# Patient Record
Sex: Male | Born: 1945
Health system: Southern US, Community
[De-identification: ages and names within clinical notes are randomized; demographics above are authoritative.]

## PROBLEM LIST (undated history)

## (undated) DIAGNOSIS — L570 Actinic keratosis: Secondary | ICD-10-CM

## (undated) DIAGNOSIS — I2510 Atherosclerotic heart disease of native coronary artery without angina pectoris: Secondary | ICD-10-CM

## (undated) DIAGNOSIS — E785 Hyperlipidemia, unspecified: Secondary | ICD-10-CM

## (undated) DIAGNOSIS — N200 Calculus of kidney: Secondary | ICD-10-CM

## (undated) DIAGNOSIS — T7840XA Allergy, unspecified, initial encounter: Secondary | ICD-10-CM

## (undated) HISTORY — DX: Allergy, unspecified, initial encounter: T78.40XA

## (undated) HISTORY — DX: Hyperlipidemia, unspecified: E78.5

## (undated) HISTORY — DX: Calculus of kidney: N20.0

## (undated) HISTORY — DX: Actinic keratosis: L57.0

## (undated) HISTORY — DX: Atherosclerotic heart disease of native coronary artery without angina pectoris: I25.10

## (undated) HISTORY — PX: OTHER SURGICAL HISTORY: SHX169

## (undated) HISTORY — PX: EYE SURGERY: SHX253

---

## 2004-07-24 ENCOUNTER — Ambulatory Visit: Payer: Self-pay | Admitting: *Deleted

## 2012-12-19 ENCOUNTER — Ambulatory Visit: Payer: Self-pay | Admitting: Family Medicine

## 2014-10-05 IMAGING — US SCREENING ULTRASOUND OF ABDOMINAL AORTA
1 series · 14 of 16 positions shown · non-contrast
Comparison: none

REASON FOR EXAM: Welcome to medicare
COMMENTS:

PROCEDURE:     GJULIO - GJULIO AORTA SCREENING/FAMILY HX  - December 19, 2012  [DATE]
RESULT:     History: Medicare screening .

[Series 1: screening ultrasound of abdominal aorta · 0.31mm/px · 14 of 16 slices shown]
[im 1/16]
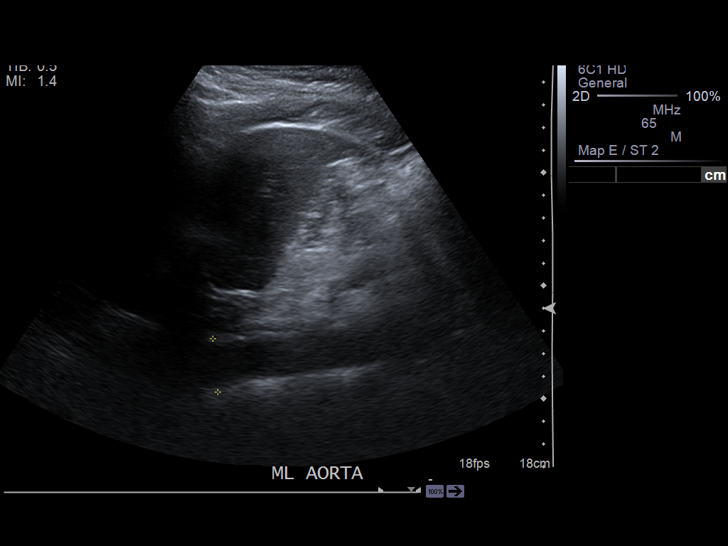
[im 2/16]
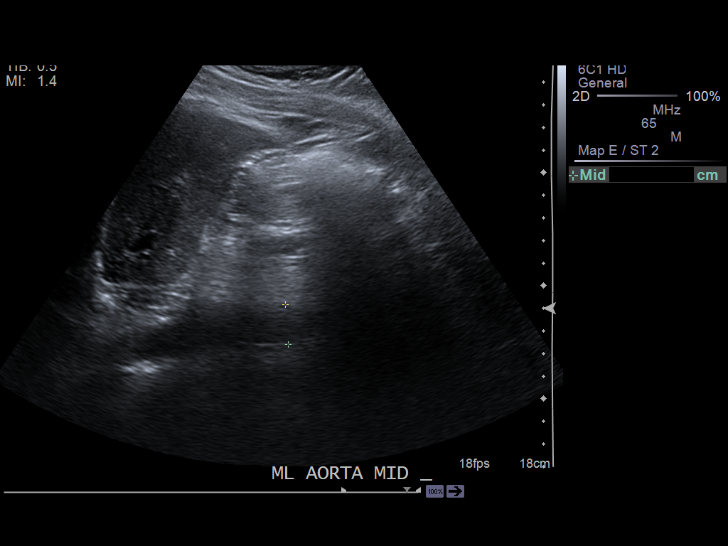
[im 3/16]
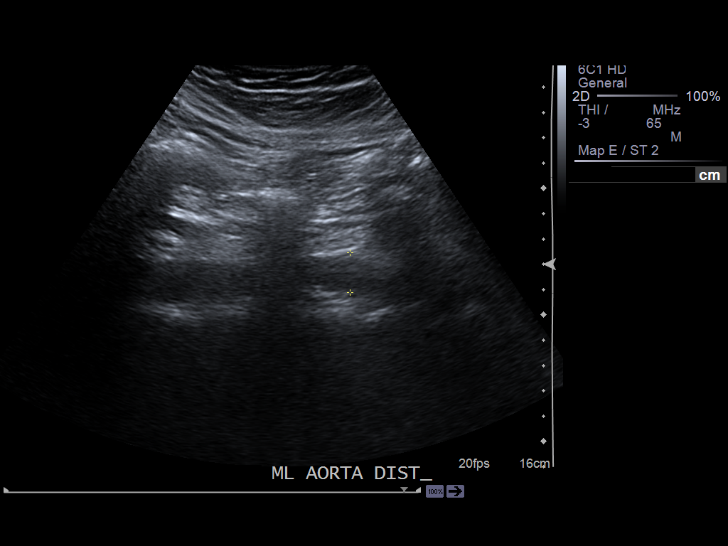
[im 5/16]
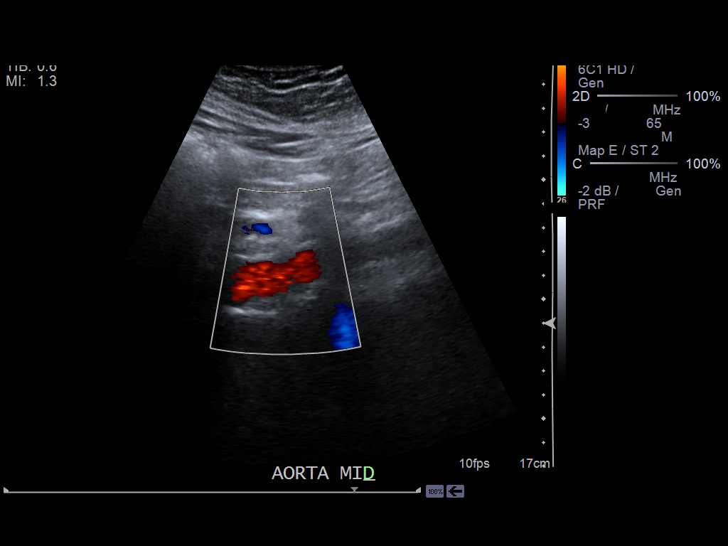
[im 6/16]
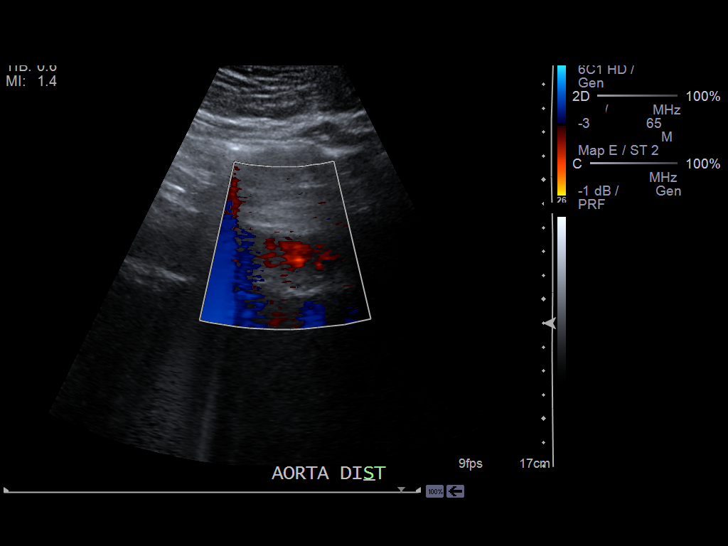
[im 7/16]
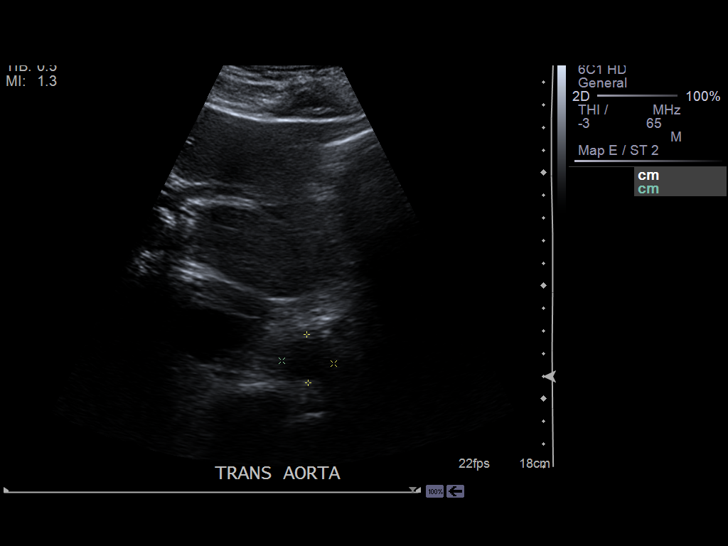
[im 8/16]
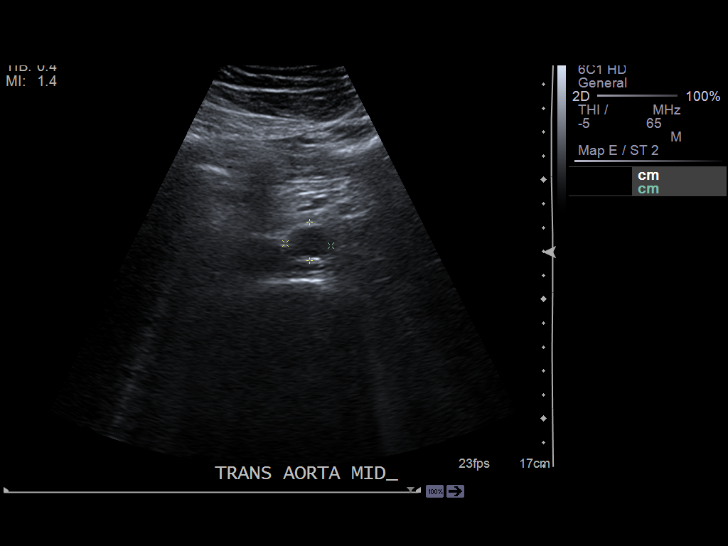
[im 9/16]
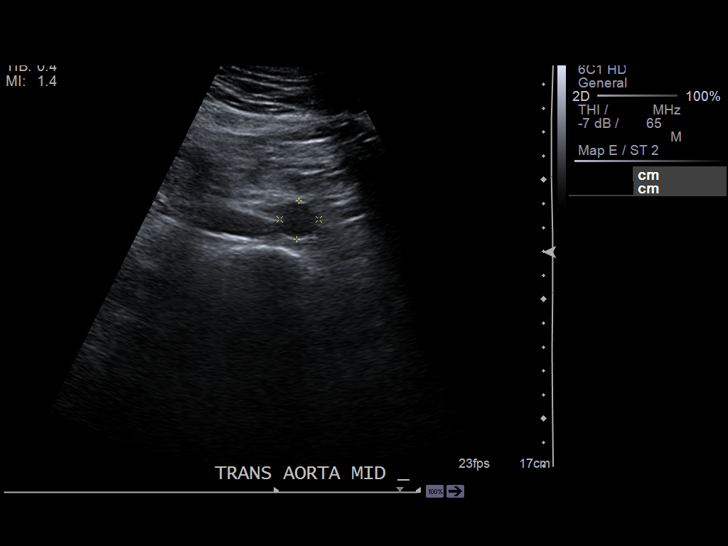
[im 10/16]
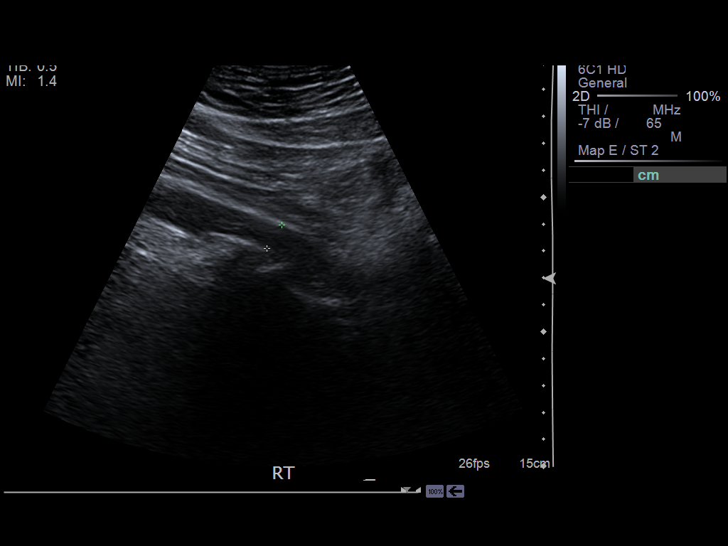
[im 11/16]
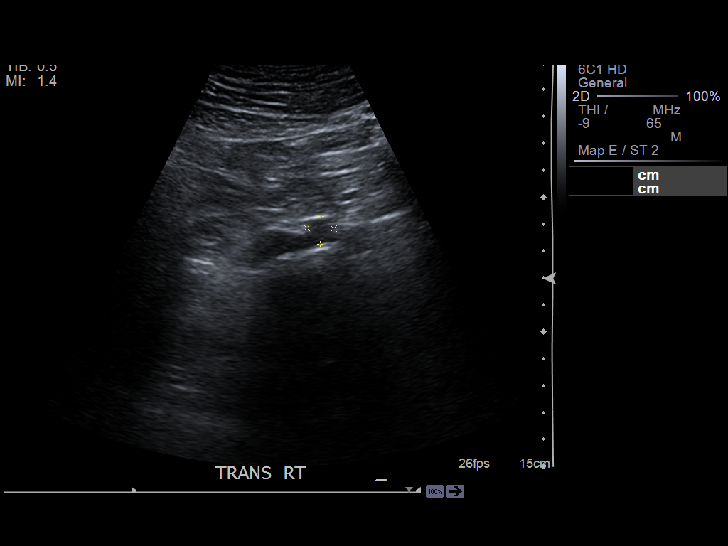
[im 13/16]
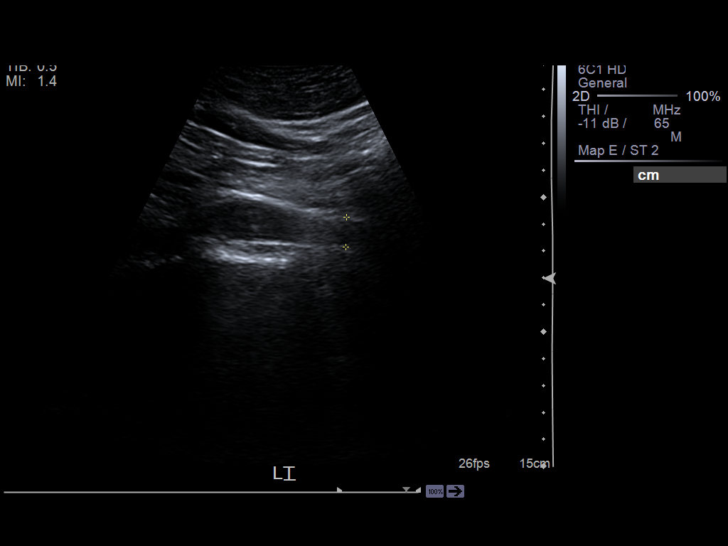
[im 14/16]
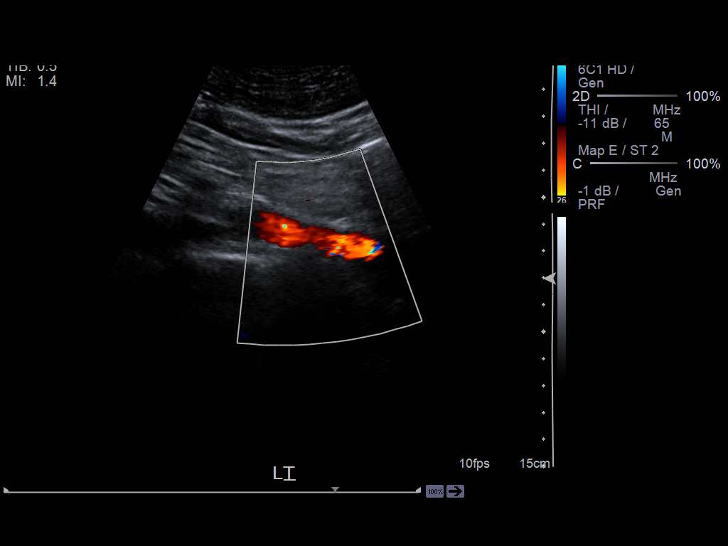
[im 15/16]
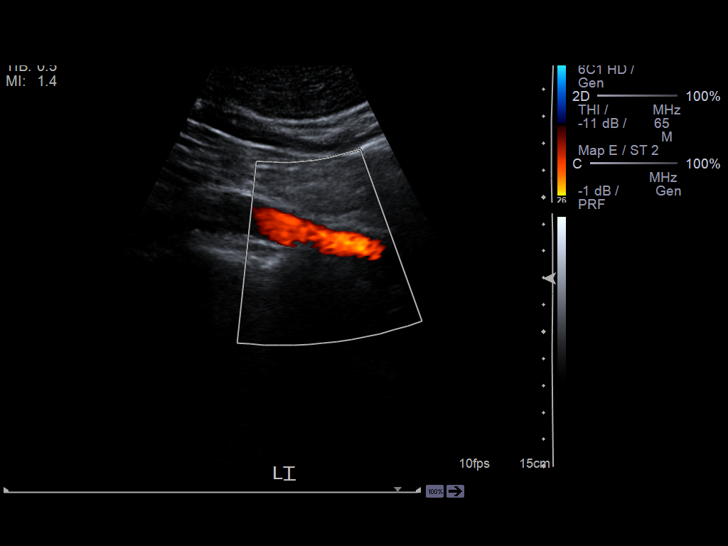
[im 16/16]
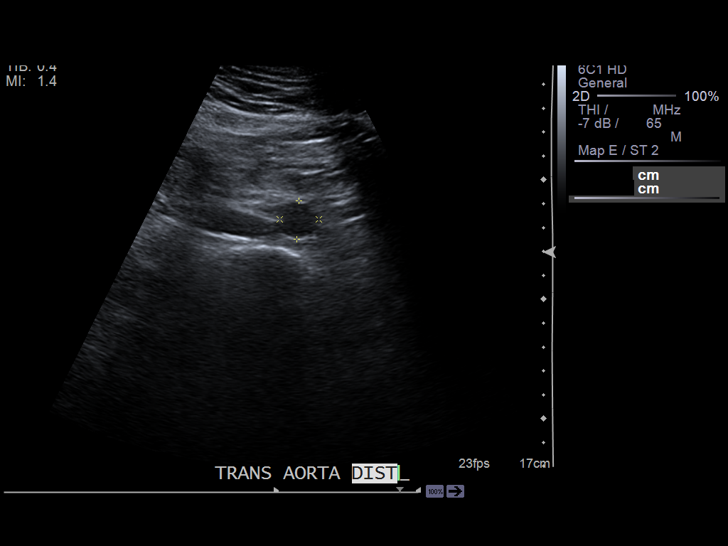

[14 of 16 positions shown; findings below may reference images not displayed]

FINDINGS: Abdominal aorta normal caliber. Aorta measures 2.3 cm in maximum
diameter. No aneurysm.
IMPRESSION: Normal exam.

## 2014-12-28 ENCOUNTER — Ambulatory Visit
Admit: 2014-12-28 | Disposition: A | Discharge status: Home / Self Care | Payer: Self-pay | Attending: Ophthalmology | Admitting: Ophthalmology

## 2015-01-02 NOTE — Op Note (Signed)
PATIENT NAME:  Walter Drake, Walter Drake MR#:  [id number] DATE OF BIRTH:  October 25, 1945  DATE OF PROCEDURE:  12/28/2014  PREOPERATIVE DIAGNOSIS:  Nuclear sclerotic cataract of the left eye.   POSTOPERATIVE DIAGNOSIS:  Nuclear sclerotic cataract of the left eye.   OPERATIVE PROCEDURE:  Cataract extraction by phacoemulsification with implant of intraocular lens to left eye.   SURGEON:  Birder Robson, MD.   ANESTHESIA:  1. Managed anesthesia care.  2. Topical tetracaine drops followed by 2% Xylocaine jelly applied in the preoperative holding area.   COMPLICATIONS:  None.   TECHNIQUE:   Stop and chop.  DESCRIPTION OF PROCEDURE:  The patient was examined and consented in the preoperative holding area where the aforementioned topical anesthesia was applied to the left eye.  The patient was brought back to the Operating Room where he was sat upright on the gurney and given a target to fixate upon while the eye was marked at the 3:00 and 9:00 position.  The patient was then reclined on the operating table, and lidocaine jelly was applied to the left eye.  The eye was prepped and draped in the usual sterile ophthalmic fashion and a lid speculum was placed. A paracentesis was created with the side port blade and the anterior chamber was filled with viscoelastic. A near clear corneal incision was performed with the steel keratome. A continuous curvilinear capsulorrhexis was performed with a cystotome followed by the capsulorrhexis forceps. Hydrodissection and hydrodelineation were carried out with BSS on a blunt cannula. The lens was removed in a stop and chop technique and the remaining cortical material was removed with the irrigation-aspiration handpiece. The eye was inflated with viscoelastic and the Tecnis Toric ZCT225, 22.0-diopter lens, serial number [id number] was placed in the eye and rotated to within a few degrees of the predetermined orientation at 109 degrees.  The remaining viscoelastic was removed from  the eye.  The Sinskey hook was used to rotate the toric lens into its final resting place at 109 degrees.  0.1 mL of cefuroxime concentration 10 mg/mL was placed in the anterior chamber. The eye was inflated to a physiologic pressure and found to be watertight.  The eye was dressed with Vigamox. The patient was given protective glasses to wear throughout the day and a shield with which to sleep tonight. The patient was also given drops with which to begin a drop regimen today and will follow-up with me in one day.    ____________________________ Livingston Diones. Aikeem Lilley, MD wlp:at D: 12/28/2014 21:00:07 ET T: 12/29/2014 09:14:01 ET JOB#: [id number]  cc: Romero Letizia L. Marleah Beever, MD, <Dictator> Livingston Diones Jaecob Lowden MD ELECTRONICALLY SIGNED 12/29/2014 12:05

## 2015-05-02 DIAGNOSIS — E785 Hyperlipidemia, unspecified: Secondary | ICD-10-CM | POA: Insufficient documentation

## 2015-05-04 ENCOUNTER — Encounter: Payer: Self-pay | Admitting: Family Medicine

## 2015-05-04 ENCOUNTER — Ambulatory Visit (INDEPENDENT_AMBULATORY_CARE_PROVIDER_SITE_OTHER): Payer: Medicare Other | Admitting: Family Medicine

## 2015-05-04 VITALS — BP 103/58 | HR 49 | Temp 98.1°F | Ht 71.0 in | Wt 174.0 lb

## 2015-05-04 DIAGNOSIS — Z0000 Encounter for general adult medical examination without abnormal findings: Secondary | ICD-10-CM | POA: Diagnosis not present

## 2015-05-04 DIAGNOSIS — I2583 Coronary atherosclerosis due to lipid rich plaque: Principal | ICD-10-CM

## 2015-05-04 DIAGNOSIS — Z125 Encounter for screening for malignant neoplasm of prostate: Secondary | ICD-10-CM

## 2015-05-04 DIAGNOSIS — E785 Hyperlipidemia, unspecified: Secondary | ICD-10-CM | POA: Diagnosis not present

## 2015-05-04 DIAGNOSIS — N400 Enlarged prostate without lower urinary tract symptoms: Secondary | ICD-10-CM | POA: Insufficient documentation

## 2015-05-04 DIAGNOSIS — I2510 Atherosclerotic heart disease of native coronary artery without angina pectoris: Secondary | ICD-10-CM

## 2015-05-04 MED ORDER — ROSUVASTATIN CALCIUM 10 MG PO TABS: 10.0000 mg | ORAL_TABLET | Freq: Every day | ORAL | Status: DC

## 2015-05-04 MED ORDER — TAMSULOSIN HCL 0.4 MG PO CAPS: 0.8000 mg | ORAL_CAPSULE | Freq: Every day | ORAL | Status: DC

## 2015-05-04 MED ORDER — METOPROLOL SUCCINATE ER 25 MG PO TB24: 25.0000 mg | ORAL_TABLET | Freq: Every day | ORAL | Status: DC

## 2015-05-04 MED ORDER — NITROGLYCERIN 0.4 MG SL SUBL: 0.4000 mg | SUBLINGUAL_TABLET | SUBLINGUAL | Status: DC | PRN

## 2015-05-04 MED ORDER — AZELASTINE HCL 0.1 % NA SOLN: 2.0000 | Freq: Two times a day (BID) | NASAL | Status: DC

## 2015-05-04 NOTE — Addendum Note (Signed)
Addended by: Wynn Maudlin on: 05/04/2015 03:30 PM   Modules accepted: Orders

## 2015-05-04 NOTE — Progress Notes (Signed)
BP 103/58 mmHg  Pulse 49  Temp(Src) 98.1 F (36.7 C)  Ht 5\' 11"  (1.803 m)  Wt 174 lb (78.926 kg)  BMI 24.28 kg/m2  SpO2 99%   Subjective:    Patient ID: Walter Drake, male    DOB: April 24, 1946, 69 y.o.   MRN: [id number]  HPI: Walter Drake is a 69 y.o. male  Chief Complaint  Patient presents with  . Annual Exam    concerned about word finding and memory issues. Concerned maybe Lipitor may be contributing.  Patient wearing support hose does have some continued varicose vein issues.  Relevant past medical, surgical, family and social history reviewed and updated as indicated. Interim medical history since our last visit reviewed. Allergies and medications reviewed and updated.  Review of Systems  Constitutional: Negative.   HENT: Negative.   Eyes: Negative.   Respiratory: Negative.   Cardiovascular: Negative.   Gastrointestinal: Negative.   Endocrine: Negative.   Genitourinary: Negative.   Musculoskeletal: Negative.   Skin: Negative.   Allergic/Immunologic: Negative.   Neurological: Negative.   Hematological: Negative.   Psychiatric/Behavioral: Negative.     Per HPI unless specifically indicated above     Objective:    BP 103/58 mmHg  Pulse 49  Temp(Src) 98.1 F (36.7 C)  Ht 5\' 11"  (1.803 m)  Wt 174 lb (78.926 kg)  BMI 24.28 kg/m2  SpO2 99%  Wt Readings from Last 3 Encounters:  05/04/15 174 lb (78.926 kg)  01/11/14 172 lb (78.019 kg)    Physical Exam  Constitutional: He is oriented to person, place, and time. He appears well-developed and well-nourished.  HENT:  Head: Normocephalic and atraumatic.  Right Ear: External ear normal.  Left Ear: External ear normal.  Eyes: Conjunctivae and EOM are normal. Pupils are equal, round, and reactive to light.  Neck: Normal range of motion. Neck supple.  Cardiovascular: Normal rate, regular rhythm, normal heart sounds and intact distal pulses.   Pulmonary/Chest: Effort normal and breath sounds normal.   Abdominal: Soft. Bowel sounds are normal. There is no splenomegaly or hepatomegaly.  Genitourinary: Rectum normal, prostate normal and penis normal.  Atrophic testes  Musculoskeletal: Normal range of motion.  Neurological: He is alert and oriented to person, place, and time. He has normal reflexes.  Skin: No rash noted. No erythema.  Psychiatric: He has a normal mood and affect. His behavior is normal. Judgment and thought content normal.    No results found for this or any previous visit.    Assessment & Plan:   Problem List Items Addressed This Visit      Cardiovascular and Mediastinum   CAD (coronary artery disease) - Primary   Relevant Medications   nitroGLYCERIN (NITROSTAT) 0.4 MG SL tablet   metoprolol succinate (TOPROL-XL) 25 MG 24 hr tablet   rosuvastatin (CRESTOR) 10 MG tablet     Genitourinary   BPH (benign prostatic hyperplasia)   Relevant Medications   tamsulosin (FLOMAX) 0.4 MG CAPS capsule     Other   Hyperlipidemia    The current medical regimen is effective;  continue present plan and medications. Patient also with word finding issues concerned about memory Mini-Mental Status exam perfect score of 30 With word finding issues may be related to atorvastatin Will discontinue atorvastatin start Crestor 10 mg Recheck lipids ALT AST before patient leaves town next month.       Relevant Medications   nitroGLYCERIN (NITROSTAT) 0.4 MG SL tablet   metoprolol succinate (TOPROL-XL) 25 MG  24 hr tablet   rosuvastatin (CRESTOR) 10 MG tablet    Other Visit Diagnoses    PE (physical exam), annual            Follow up plan: No Follow-up on file.

## 2015-05-04 NOTE — Assessment & Plan Note (Addendum)
The current medical regimen is effective;  continue present plan and medications. Patient also with word finding issues concerned about memory Mini-Mental Status exam perfect score of 30 With word finding issues may be related to atorvastatin Will discontinue atorvastatin start Crestor 10 mg Recheck lipids ALT AST before patient leaves town next month.

## 2015-05-04 NOTE — Addendum Note (Signed)
Addended byGolden Pop on: 05/04/2015 03:17 PM   Modules accepted: Orders, SmartSet

## 2015-05-05 ENCOUNTER — Telehealth: Payer: Self-pay

## 2015-05-05 ENCOUNTER — Encounter: Payer: Self-pay | Admitting: Family Medicine

## 2015-05-05 ENCOUNTER — Other Ambulatory Visit: Payer: Self-pay | Admitting: Family Medicine

## 2015-05-05 LAB — LP+ALT+AST PICCOLO, WAIVED
ALT (SGPT) PICCOLO, WAIVED: 31 U/L (ref 10–47)
AST (SGOT) PICCOLO, WAIVED: 55 U/L — AB (ref 11–38)
CHOL/HDL RATIO PICCOLO,WAIVE: 2.3 mg/dL
Cholesterol Piccolo, Waived: 112 mg/dL (ref <200)
HDL CHOL PICCOLO, WAIVED: 48 mg/dL — AB (ref >59)
LDL Chol Calc Piccolo Waived: 52 mg/dL (ref <100)
TRIGLYCERIDES PICCOLO,WAIVED: 61 mg/dL (ref <150)
VLDL Chol Calc Piccolo,Waive: 12 mg/dL (ref <30)

## 2015-05-05 MED ORDER — TAMSULOSIN HCL 0.4 MG PO CAPS: 0.8000 mg | ORAL_CAPSULE | Freq: Every day | ORAL | Status: DC

## 2015-05-05 NOTE — Telephone Encounter (Signed)
Wanting to know if  You want to write Tamsulosin 0.4 mg 2caps QD #90  Or for 90 day supply  [place]

## 2015-05-24 ENCOUNTER — Encounter: Payer: Self-pay | Admitting: Family Medicine

## 2015-05-24 ENCOUNTER — Ambulatory Visit (INDEPENDENT_AMBULATORY_CARE_PROVIDER_SITE_OTHER): Payer: Medicare Other | Admitting: Family Medicine

## 2015-05-24 VITALS — BP 112/57 | HR 47 | Temp 97.6°F | Ht 71.0 in | Wt 176.0 lb

## 2015-05-24 DIAGNOSIS — Z23 Encounter for immunization: Secondary | ICD-10-CM | POA: Diagnosis not present

## 2015-05-24 DIAGNOSIS — E785 Hyperlipidemia, unspecified: Secondary | ICD-10-CM | POA: Diagnosis not present

## 2015-05-24 LAB — LP+ALT+AST PICCOLO, WAIVED
ALT (SGPT) Piccolo, Waived: 21 U/L (ref 10–47)
AST (SGOT) PICCOLO, WAIVED: 29 U/L (ref 11–38)
CHOL/HDL RATIO PICCOLO,WAIVE: 2.3 mg/dL
CHOLESTEROL PICCOLO, WAIVED: 117 mg/dL (ref <200)
HDL Chol Piccolo, Waived: 52 mg/dL — ABNORMAL LOW (ref >59)
LDL Chol Calc Piccolo Waived: 51 mg/dL (ref <100)
TRIGLYCERIDES PICCOLO,WAIVED: 70 mg/dL (ref <150)
VLDL Chol Calc Piccolo,Waive: 14 mg/dL (ref <30)

## 2015-05-24 NOTE — Assessment & Plan Note (Signed)
Doing well on new medications will continue

## 2015-05-24 NOTE — Progress Notes (Signed)
   BP 112/57 mmHg  Pulse 47  Temp(Src) 97.6 F (36.4 C)  Ht 5\' 11"  (1.803 m)  Wt 176 lb (79.833 kg)  BMI 24.56 kg/m2  SpO2 99%   Subjective:    Patient ID: Walter Drake, Walter Drake    DOB: 1946/02/06, 69 y.o.   MRN: [id number]  HPI: Walter Drake is a 69 y.o. Walter Drake  Chief Complaint  Patient presents with  . Hyperlipidemia   patient follow-up change from Lipitor to Crestor has done well no complaints from medications as taken faithfully. Word finding issues seem to have gotten better wife has noticed a marked difference  Relevant past medical, surgical, family and social history reviewed and updated as indicated. Interim medical history since our last visit reviewed. Allergies and medications reviewed and updated.  Review of Systems  Per HPI unless specifically indicated above     Objective:    BP 112/57 mmHg  Pulse 47  Temp(Src) 97.6 F (36.4 C)  Ht 5\' 11"  (1.803 m)  Wt 176 lb (79.833 kg)  BMI 24.56 kg/m2  SpO2 99%  Wt Readings from Last 3 Encounters:  05/24/15 176 lb (79.833 kg)  05/04/15 174 lb (78.926 kg)  01/11/14 172 lb (78.019 kg)    Physical Exam  Results for orders placed or performed in visit on 05/04/15  LP+ALT+AST Piccolo, Waived  Result Value Ref Range   ALT (SGPT) Piccolo, Waived 31 10 - 47 U/L   AST (SGOT) Piccolo, Waived 55 (H) 11 - 38 U/L   Cholesterol Piccolo, Waived 112 <200 mg/dL   HDL Chol Piccolo, Waived 48 (L) >59 mg/dL   Triglycerides Piccolo,Waived 61 <150 mg/dL   Chol/HDL Ratio Piccolo,Waive 2.3 mg/dL   LDL Chol Calc Piccolo Waived 52 <100 mg/dL   VLDL Chol Calc Piccolo,Waive 12 <30 mg/dL      Assessment & Plan:   Problem List Items Addressed This Visit      Other   Hyperlipidemia    Doing well on new medications will continue       Other Visit Diagnoses    Hyperlipemia    -  Primary    Relevant Orders    LP+ALT+AST Piccolo, Waived    Immunization due        Relevant Orders    Flu Vaccine QUAD 36+ mos PF IM (Fluarix  & Fluzone Quad PF) (Completed)        Follow up plan: Return in about 6 months (around 11/21/2015), or if symptoms worsen or fail to improve, for 6 mo.

## 2015-10-04 ENCOUNTER — Telehealth: Payer: Self-pay | Admitting: Family Medicine

## 2015-10-04 NOTE — Telephone Encounter (Signed)
Pt called to injorm [place] that he was had his Pneumonia shot March 30 2014 at [facility].

## 2015-12-22 ENCOUNTER — Ambulatory Visit: Payer: Medicare Other | Admitting: Family Medicine

## 2015-12-26 DIAGNOSIS — H26492 Other secondary cataract, left eye: Secondary | ICD-10-CM | POA: Diagnosis not present

## 2015-12-28 DIAGNOSIS — L570 Actinic keratosis: Secondary | ICD-10-CM | POA: Diagnosis not present

## 2015-12-28 DIAGNOSIS — D1800 Hemangioma unspecified site: Secondary | ICD-10-CM | POA: Diagnosis not present

## 2015-12-28 DIAGNOSIS — L812 Freckles: Secondary | ICD-10-CM | POA: Diagnosis not present

## 2015-12-28 DIAGNOSIS — I8393 Asymptomatic varicose veins of bilateral lower extremities: Secondary | ICD-10-CM | POA: Diagnosis not present

## 2015-12-28 DIAGNOSIS — D485 Neoplasm of uncertain behavior of skin: Secondary | ICD-10-CM | POA: Diagnosis not present

## 2015-12-28 DIAGNOSIS — D229 Melanocytic nevi, unspecified: Secondary | ICD-10-CM | POA: Diagnosis not present

## 2015-12-28 DIAGNOSIS — Z1283 Encounter for screening for malignant neoplasm of skin: Secondary | ICD-10-CM | POA: Diagnosis not present

## 2015-12-28 DIAGNOSIS — L578 Other skin changes due to chronic exposure to nonionizing radiation: Secondary | ICD-10-CM | POA: Diagnosis not present

## 2015-12-28 DIAGNOSIS — L821 Other seborrheic keratosis: Secondary | ICD-10-CM | POA: Diagnosis not present

## 2016-02-09 ENCOUNTER — Other Ambulatory Visit: Payer: Self-pay | Admitting: Family Medicine

## 2016-05-23 ENCOUNTER — Ambulatory Visit (INDEPENDENT_AMBULATORY_CARE_PROVIDER_SITE_OTHER): Payer: Medicare Other | Admitting: Family Medicine

## 2016-05-23 ENCOUNTER — Encounter: Payer: Self-pay | Admitting: Family Medicine

## 2016-05-23 VITALS — BP 118/70 | HR 47 | Temp 97.6°F | Ht 70.0 in | Wt 176.9 lb

## 2016-05-23 DIAGNOSIS — I2583 Coronary atherosclerosis due to lipid rich plaque: Secondary | ICD-10-CM

## 2016-05-23 DIAGNOSIS — E785 Hyperlipidemia, unspecified: Secondary | ICD-10-CM | POA: Diagnosis not present

## 2016-05-23 DIAGNOSIS — Z0000 Encounter for general adult medical examination without abnormal findings: Secondary | ICD-10-CM

## 2016-05-23 DIAGNOSIS — I2510 Atherosclerotic heart disease of native coronary artery without angina pectoris: Secondary | ICD-10-CM | POA: Diagnosis not present

## 2016-05-23 DIAGNOSIS — N400 Enlarged prostate without lower urinary tract symptoms: Secondary | ICD-10-CM | POA: Diagnosis not present

## 2016-05-23 LAB — URINALYSIS, ROUTINE W REFLEX MICROSCOPIC
Bilirubin, UA: NEGATIVE
GLUCOSE, UA: NEGATIVE
KETONES UA: NEGATIVE
Leukocytes, UA: NEGATIVE
NITRITE UA: NEGATIVE
PH UA: 6 (ref 5.0–7.5)
PROTEIN UA: NEGATIVE
RBC, UA: NEGATIVE
Specific Gravity, UA: <1.005 — ABNORMAL LOW (ref 1.005–1.030)
UUROB: 0.2 mg/dL (ref 0.2–1.0)

## 2016-05-23 MED ORDER — ROSUVASTATIN CALCIUM 10 MG PO TABS: 10.0000 mg | ORAL_TABLET | Freq: Every day | ORAL | 4 refills | Status: DC

## 2016-05-23 MED ORDER — TAMSULOSIN HCL 0.4 MG PO CAPS: 0.8000 mg | ORAL_CAPSULE | Freq: Every day | ORAL | 4 refills | Status: DC

## 2016-05-23 MED ORDER — AZELASTINE HCL 0.1 % NA SOLN: 2.0000 | Freq: Two times a day (BID) | NASAL | 4 refills | Status: DC

## 2016-05-23 MED ORDER — NITROGLYCERIN 0.4 MG SL SUBL: 0.4000 mg | SUBLINGUAL_TABLET | SUBLINGUAL | 3 refills | Status: DC | PRN

## 2016-05-23 NOTE — Assessment & Plan Note (Signed)
The current medical regimen is effective;  continue present plan and medications.  

## 2016-05-23 NOTE — Progress Notes (Signed)
BP 118/70 (BP Location: Left Arm, Patient Position: Sitting, Cuff Size: Normal)   Pulse (!) 47   Temp 97.6 F (36.4 C)   Ht 5\' 10"  (1.778 m)   Wt 176 lb 14.4 oz (80.2 kg)   SpO2 99%   BMI 25.38 kg/m    Subjective:    Patient ID: Walter Drake, male    DOB: 1946-05-17, 70 y.o.   MRN: [id number]  HPI: Walter Drake is a 70 y.o. male  Chief Complaint  Patient presents with  . Annual Exam  Patient all in all doing well no issues with medications has some concerns.   Concerned about pulse being low and taking metoprolol 25 mg one half tablet a day. Patient was started on metoprolol by cardiology after triple bypass over 3 years ago.Marland Kitchen Has done well with no cardiovascular symptoms.  Patient also taking Crestor no issues 10 mg a day wondering if he can get by on lower dose  Taking no spray prostate medicines without issues no BPH symptoms or significant allergy systems.   Relevant past medical, surgical, family and social history reviewed and updated as indicated. Interim medical history since our last visit reviewed. Allergies and medications reviewed and updated.  Review of Systems  Constitutional: Negative.   HENT: Negative.   Eyes: Negative.   Respiratory: Negative.   Cardiovascular: Negative.   Gastrointestinal: Negative.   Endocrine: Negative.   Genitourinary: Negative.   Musculoskeletal: Negative.   Skin: Negative.   Allergic/Immunologic: Negative.   Neurological: Negative.   Hematological: Negative.   Psychiatric/Behavioral: Negative.     Per HPI unless specifically indicated above     Objective:    BP 118/70 (BP Location: Left Arm, Patient Position: Sitting, Cuff Size: Normal)   Pulse (!) 47   Temp 97.6 F (36.4 C)   Ht 5\' 10"  (1.778 m)   Wt 176 lb 14.4 oz (80.2 kg)   SpO2 99%   BMI 25.38 kg/m   Wt Readings from Last 3 Encounters:  05/23/16 176 lb 14.4 oz (80.2 kg)  05/24/15 176 lb (79.8 kg)  05/04/15 174 lb (78.9 kg)    Physical Exam    Constitutional: He is oriented to person, place, and time. He appears well-developed and well-nourished.  HENT:  Head: Normocephalic and atraumatic.  Right Ear: External ear normal.  Left Ear: External ear normal.  Eyes: Conjunctivae and EOM are normal. Pupils are equal, round, and reactive to light.  Neck: Normal range of motion. Neck supple.  Cardiovascular: Normal rate, regular rhythm, normal heart sounds and intact distal pulses.   Pulmonary/Chest: Effort normal and breath sounds normal.  Abdominal: Soft. Bowel sounds are normal. There is no splenomegaly or hepatomegaly.  Genitourinary: Rectum normal and penis normal.  Genitourinary Comments: Atrophic testes Prostate enlarged  Musculoskeletal: Normal range of motion.  Neurological: He is alert and oriented to person, place, and time. He has normal reflexes.  Skin: No rash noted. No erythema.  Psychiatric: He has a normal mood and affect. His behavior is normal. Judgment and thought content normal.    Results for orders placed or performed in visit on 05/24/15  LP+ALT+AST Piccolo, [employer]  Result Value Ref Range   ALT (SGPT) Piccolo, Waived 21 10 - 47 U/L   AST (SGOT) Piccolo, Waived 29 11 - 38 U/L   Cholesterol Piccolo, Waived 117 <200 mg/dL   HDL Chol Piccolo, Waived 52 (L) >59 mg/dL   Triglycerides Piccolo,Waived 70 <150 mg/dL   Chol/HDL Ratio  Piccolo,Waive 2.3 mg/dL   LDL Chol Calc Piccolo Waived 51 <100 mg/dL   VLDL Chol Calc Piccolo,Waive 14 <30 mg/dL      Assessment & Plan:   Problem List Items Addressed This Visit      Cardiovascular and Mediastinum   CAD (coronary artery disease)    Discussed with patient metoprolol and bradycardia will stop metoprolol and observe bradycardia blood pressure. If bradycardia persists will need further evaluation.      Relevant Medications   rosuvastatin (CRESTOR) 10 MG tablet   nitroGLYCERIN (NITROSTAT) 0.4 MG SL tablet   Other Relevant Orders   Comprehensive metabolic panel    CBC with Differential/Platelet   TSH   Urinalysis, Routine w reflex microscopic (not at [facility])     Genitourinary   BPH (benign prostatic hyperplasia)    The current medical regimen is effective;  continue present plan and medications.       Relevant Medications   tamsulosin (FLOMAX) 0.4 MG CAPS capsule   Other Relevant Orders   CBC with Differential/Platelet   TSH   Urinalysis, Routine w reflex microscopic (not at [facility])   PSA     Other   Hyperlipidemia    The current medical regimen is effective;  continue present plan and medications.       Relevant Medications   rosuvastatin (CRESTOR) 10 MG tablet   nitroGLYCERIN (NITROSTAT) 0.4 MG SL tablet   Other Relevant Orders   Comprehensive metabolic panel   Lipid panel   CBC with Differential/Platelet   TSH   Urinalysis, Routine w reflex microscopic (not at [facility])    Other Visit Diagnoses    PE (physical exam), annual    -  Primary   Relevant Orders   Comprehensive metabolic panel   Lipid panel   CBC with Differential/Platelet   TSH   Urinalysis, Routine w reflex microscopic (not at [facility])   PSA       Follow up plan: Return for Physical Exam. one year where ever you are in the [place].

## 2016-05-23 NOTE — Assessment & Plan Note (Signed)
Discussed with patient metoprolol and bradycardia will stop metoprolol and observe bradycardia blood pressure. If bradycardia persists will need further evaluation.

## 2016-05-23 NOTE — Addendum Note (Signed)
Addended by: Wynn Maudlin on: 05/23/2016 04:25 PM   Modules accepted: Miquel Dunn

## 2016-05-24 ENCOUNTER — Encounter: Payer: Self-pay | Admitting: Family Medicine

## 2016-05-24 LAB — COMPREHENSIVE METABOLIC PANEL
ALBUMIN: 4.4 g/dL (ref 3.6–4.8)
ALT: 20 IU/L (ref 0–44)
AST: 24 IU/L (ref 0–40)
Albumin/Globulin Ratio: 1.5 (ref 1.2–2.2)
Alkaline Phosphatase: 66 IU/L (ref 39–117)
BILIRUBIN TOTAL: 0.9 mg/dL (ref 0.0–1.2)
BUN / CREAT RATIO: 15 (ref 10–24)
BUN: 15 mg/dL (ref 8–27)
CALCIUM: 9.3 mg/dL (ref 8.6–10.2)
CHLORIDE: 103 mmol/L (ref 96–106)
CO2: 26 mmol/L (ref 18–29)
CREATININE: 0.97 mg/dL (ref 0.76–1.27)
GFR, EST AFRICAN AMERICAN: 92 mL/min/{1.73_m2} (ref >59)
GFR, EST NON AFRICAN AMERICAN: 80 mL/min/{1.73_m2} (ref >59)
GLUCOSE: 89 mg/dL (ref 65–99)
Globulin, Total: 2.9 g/dL (ref 1.5–4.5)
Potassium: 4.9 mmol/L (ref 3.5–5.2)
Sodium: 143 mmol/L (ref 134–144)
TOTAL PROTEIN: 7.3 g/dL (ref 6.0–8.5)

## 2016-05-24 LAB — CBC WITH DIFFERENTIAL/PLATELET
Basophils Absolute: 0 10*3/uL (ref 0.0–0.2)
Basos: 0 %
EOS (ABSOLUTE): 0.4 10*3/uL (ref 0.0–0.4)
EOS: 7 %
HEMATOCRIT: 43.2 % (ref 37.5–51.0)
Hemoglobin: 14.8 g/dL (ref 12.6–17.7)
IMMATURE GRANULOCYTES: 0 %
Immature Grans (Abs): 0 10*3/uL (ref 0.0–0.1)
LYMPHS ABS: 2.2 10*3/uL (ref 0.7–3.1)
Lymphs: 40 %
MCH: 32.9 pg (ref 26.6–33.0)
MCHC: 34.3 g/dL (ref 31.5–35.7)
MCV: 96 fL (ref 79–97)
MONOS ABS: 0.6 10*3/uL (ref 0.1–0.9)
Monocytes: 11 %
NEUTROS PCT: 42 %
Neutrophils Absolute: 2.4 10*3/uL (ref 1.4–7.0)
PLATELETS: 122 10*3/uL — AB (ref 150–379)
RBC: 4.5 x10E6/uL (ref 4.14–5.80)
RDW: 13.6 % (ref 12.3–15.4)
WBC: 5.7 10*3/uL (ref 3.4–10.8)

## 2016-05-24 LAB — LIPID PANEL
CHOL/HDL RATIO: 2.2 ratio (ref 0.0–5.0)
Cholesterol, Total: 130 mg/dL (ref 100–199)
HDL: 58 mg/dL (ref >39)
LDL CALC: 60 mg/dL (ref 0–99)
Triglycerides: 60 mg/dL (ref 0–149)
VLDL CHOLESTEROL CAL: 12 mg/dL (ref 5–40)

## 2016-05-24 LAB — TSH: TSH: 1.7 u[IU]/mL (ref 0.450–4.500)

## 2016-05-24 LAB — PSA: PROSTATE SPECIFIC AG, SERUM: 1.6 ng/mL (ref 0.0–4.0)

## 2016-05-29 DIAGNOSIS — Z23 Encounter for immunization: Secondary | ICD-10-CM | POA: Diagnosis not present

## 2016-06-04 ENCOUNTER — Telehealth: Payer: Self-pay

## 2016-06-04 NOTE — Telephone Encounter (Signed)
Pt had flu shot on 05/29/16 per wife "he had the one for seniors"

## 2016-06-04 NOTE — Telephone Encounter (Signed)
Discuss recent BP readings off of Metoprolol

## 2016-06-04 NOTE — Telephone Encounter (Signed)
Wants to discuss BP since stopping Metoprolol  Systolic running:  [id number]  Pulse 58-62

## 2016-06-15 ENCOUNTER — Telehealth: Payer: Self-pay | Admitting: Family Medicine

## 2016-06-15 NOTE — Telephone Encounter (Signed)
Pt would like to know if it is ok with him to take a certain medication. Pt didn't give me the name of the medication.

## 2016-06-15 NOTE — Telephone Encounter (Signed)
Called patient.   Patient should not take anything that is not prescribed to him. Explained to patient if he feels as though he is ill with a cough he should be seen by [place] on Monday or at an urgent care.  Patient denied urgent care or an appointment. Said he's in good shape, he just was coughing and it hurt and didn't know if that would effect him with his heart. Explained to patient not take anything that's not prescribed to him.

## 2016-06-15 NOTE — Telephone Encounter (Signed)
See other phone note from 06/15/2016

## 2016-06-15 NOTE — Telephone Encounter (Signed)
Pt called stated he has a bad cough, his wife had a cold and received an RX for Tessalon Pearls. Can pt take these? His wife has some left over. Thanks.

## 2016-06-25 DIAGNOSIS — H2511 Age-related nuclear cataract, right eye: Secondary | ICD-10-CM | POA: Diagnosis not present

## 2016-07-02 DIAGNOSIS — L812 Freckles: Secondary | ICD-10-CM | POA: Diagnosis not present

## 2016-07-02 DIAGNOSIS — I8310 Varicose veins of unspecified lower extremity with inflammation: Secondary | ICD-10-CM | POA: Diagnosis not present

## 2016-07-02 DIAGNOSIS — D1800 Hemangioma unspecified site: Secondary | ICD-10-CM | POA: Diagnosis not present

## 2016-07-02 DIAGNOSIS — L918 Other hypertrophic disorders of the skin: Secondary | ICD-10-CM | POA: Diagnosis not present

## 2016-07-02 DIAGNOSIS — D229 Melanocytic nevi, unspecified: Secondary | ICD-10-CM | POA: Diagnosis not present

## 2016-07-02 DIAGNOSIS — D485 Neoplasm of uncertain behavior of skin: Secondary | ICD-10-CM | POA: Diagnosis not present

## 2016-07-02 DIAGNOSIS — Z1283 Encounter for screening for malignant neoplasm of skin: Secondary | ICD-10-CM | POA: Diagnosis not present

## 2016-07-02 DIAGNOSIS — L570 Actinic keratosis: Secondary | ICD-10-CM | POA: Diagnosis not present

## 2016-07-02 DIAGNOSIS — L821 Other seborrheic keratosis: Secondary | ICD-10-CM | POA: Diagnosis not present

## 2016-07-02 DIAGNOSIS — I8393 Asymptomatic varicose veins of bilateral lower extremities: Secondary | ICD-10-CM | POA: Diagnosis not present

## 2016-07-02 DIAGNOSIS — L578 Other skin changes due to chronic exposure to nonionizing radiation: Secondary | ICD-10-CM | POA: Diagnosis not present

## 2016-08-28 ENCOUNTER — Telehealth: Payer: Self-pay | Admitting: Family Medicine

## 2016-08-28 NOTE — Telephone Encounter (Signed)
Pt can try gentle stretches and naproxen or ibuprofen, but if concerned I recommend he should go to a local Urgent Care to be evaluated.

## 2016-08-28 NOTE — Telephone Encounter (Signed)
Pt called and stated that he drove down to [place] and now he  has a constant burning sensation R hip and he would like suggestions on what to do.

## 2016-08-28 NOTE — Telephone Encounter (Signed)
Patient notified

## 2016-09-06 DIAGNOSIS — M545 Low back pain: Secondary | ICD-10-CM | POA: Diagnosis not present

## 2016-09-06 DIAGNOSIS — M5441 Lumbago with sciatica, right side: Secondary | ICD-10-CM | POA: Diagnosis not present

## 2016-09-06 DIAGNOSIS — R262 Difficulty in walking, not elsewhere classified: Secondary | ICD-10-CM | POA: Diagnosis not present

## 2016-09-07 DIAGNOSIS — M5441 Lumbago with sciatica, right side: Secondary | ICD-10-CM | POA: Diagnosis not present

## 2016-09-07 DIAGNOSIS — R262 Difficulty in walking, not elsewhere classified: Secondary | ICD-10-CM | POA: Diagnosis not present

## 2016-09-07 DIAGNOSIS — M545 Low back pain: Secondary | ICD-10-CM | POA: Diagnosis not present

## 2016-09-11 DIAGNOSIS — R262 Difficulty in walking, not elsewhere classified: Secondary | ICD-10-CM | POA: Diagnosis not present

## 2016-09-11 DIAGNOSIS — M5441 Lumbago with sciatica, right side: Secondary | ICD-10-CM | POA: Diagnosis not present

## 2016-09-11 DIAGNOSIS — I2510 Atherosclerotic heart disease of native coronary artery without angina pectoris: Secondary | ICD-10-CM | POA: Diagnosis not present

## 2016-09-11 DIAGNOSIS — M545 Low back pain: Secondary | ICD-10-CM | POA: Diagnosis not present

## 2016-09-11 DIAGNOSIS — S39012A Strain of muscle, fascia and tendon of lower back, initial encounter: Secondary | ICD-10-CM | POA: Diagnosis not present

## 2016-09-12 DIAGNOSIS — M4317 Spondylolisthesis, lumbosacral region: Secondary | ICD-10-CM | POA: Diagnosis not present

## 2016-09-12 DIAGNOSIS — M5117 Intervertebral disc disorders with radiculopathy, lumbosacral region: Secondary | ICD-10-CM | POA: Diagnosis not present

## 2016-09-12 DIAGNOSIS — M4186 Other forms of scoliosis, lumbar region: Secondary | ICD-10-CM | POA: Diagnosis not present

## 2016-09-13 DIAGNOSIS — M5441 Lumbago with sciatica, right side: Secondary | ICD-10-CM | POA: Diagnosis not present

## 2016-09-13 DIAGNOSIS — M545 Low back pain: Secondary | ICD-10-CM | POA: Diagnosis not present

## 2016-09-13 DIAGNOSIS — R262 Difficulty in walking, not elsewhere classified: Secondary | ICD-10-CM | POA: Diagnosis not present

## 2016-09-17 DIAGNOSIS — M545 Low back pain: Secondary | ICD-10-CM | POA: Diagnosis not present

## 2016-09-17 DIAGNOSIS — R262 Difficulty in walking, not elsewhere classified: Secondary | ICD-10-CM | POA: Diagnosis not present

## 2016-09-17 DIAGNOSIS — M5441 Lumbago with sciatica, right side: Secondary | ICD-10-CM | POA: Diagnosis not present

## 2016-09-19 DIAGNOSIS — R262 Difficulty in walking, not elsewhere classified: Secondary | ICD-10-CM | POA: Diagnosis not present

## 2016-09-19 DIAGNOSIS — M5441 Lumbago with sciatica, right side: Secondary | ICD-10-CM | POA: Diagnosis not present

## 2016-09-19 DIAGNOSIS — M545 Low back pain: Secondary | ICD-10-CM | POA: Diagnosis not present

## 2016-09-24 DIAGNOSIS — M5441 Lumbago with sciatica, right side: Secondary | ICD-10-CM | POA: Diagnosis not present

## 2016-09-24 DIAGNOSIS — R262 Difficulty in walking, not elsewhere classified: Secondary | ICD-10-CM | POA: Diagnosis not present

## 2016-09-24 DIAGNOSIS — M545 Low back pain: Secondary | ICD-10-CM | POA: Diagnosis not present

## 2016-09-26 DIAGNOSIS — M545 Low back pain: Secondary | ICD-10-CM | POA: Diagnosis not present

## 2016-09-26 DIAGNOSIS — R262 Difficulty in walking, not elsewhere classified: Secondary | ICD-10-CM | POA: Diagnosis not present

## 2016-09-26 DIAGNOSIS — M5441 Lumbago with sciatica, right side: Secondary | ICD-10-CM | POA: Diagnosis not present

## 2016-09-27 DIAGNOSIS — M5126 Other intervertebral disc displacement, lumbar region: Secondary | ICD-10-CM | POA: Diagnosis not present

## 2016-10-16 DIAGNOSIS — M5416 Radiculopathy, lumbar region: Secondary | ICD-10-CM | POA: Diagnosis not present

## 2016-10-16 DIAGNOSIS — M48061 Spinal stenosis, lumbar region without neurogenic claudication: Secondary | ICD-10-CM | POA: Diagnosis not present

## 2016-10-16 DIAGNOSIS — M47816 Spondylosis without myelopathy or radiculopathy, lumbar region: Secondary | ICD-10-CM | POA: Diagnosis not present

## 2016-10-17 DIAGNOSIS — M545 Low back pain: Secondary | ICD-10-CM | POA: Diagnosis not present

## 2016-10-17 DIAGNOSIS — R262 Difficulty in walking, not elsewhere classified: Secondary | ICD-10-CM | POA: Diagnosis not present

## 2016-10-17 DIAGNOSIS — M5441 Lumbago with sciatica, right side: Secondary | ICD-10-CM | POA: Diagnosis not present

## 2016-10-19 DIAGNOSIS — R262 Difficulty in walking, not elsewhere classified: Secondary | ICD-10-CM | POA: Diagnosis not present

## 2016-10-19 DIAGNOSIS — M5441 Lumbago with sciatica, right side: Secondary | ICD-10-CM | POA: Diagnosis not present

## 2016-10-19 DIAGNOSIS — M545 Low back pain: Secondary | ICD-10-CM | POA: Diagnosis not present

## 2016-10-24 DIAGNOSIS — M545 Low back pain: Secondary | ICD-10-CM | POA: Diagnosis not present

## 2016-10-24 DIAGNOSIS — M5441 Lumbago with sciatica, right side: Secondary | ICD-10-CM | POA: Diagnosis not present

## 2016-10-24 DIAGNOSIS — R262 Difficulty in walking, not elsewhere classified: Secondary | ICD-10-CM | POA: Diagnosis not present

## 2016-10-26 DIAGNOSIS — M5441 Lumbago with sciatica, right side: Secondary | ICD-10-CM | POA: Diagnosis not present

## 2016-10-26 DIAGNOSIS — R262 Difficulty in walking, not elsewhere classified: Secondary | ICD-10-CM | POA: Diagnosis not present

## 2016-10-26 DIAGNOSIS — M545 Low back pain: Secondary | ICD-10-CM | POA: Diagnosis not present

## 2016-10-30 DIAGNOSIS — M545 Low back pain: Secondary | ICD-10-CM | POA: Diagnosis not present

## 2016-10-30 DIAGNOSIS — M5441 Lumbago with sciatica, right side: Secondary | ICD-10-CM | POA: Diagnosis not present

## 2016-10-30 DIAGNOSIS — R262 Difficulty in walking, not elsewhere classified: Secondary | ICD-10-CM | POA: Diagnosis not present

## 2016-10-31 DIAGNOSIS — M791 Myalgia: Secondary | ICD-10-CM | POA: Diagnosis not present

## 2016-10-31 DIAGNOSIS — M47817 Spondylosis without myelopathy or radiculopathy, lumbosacral region: Secondary | ICD-10-CM | POA: Diagnosis not present

## 2016-10-31 DIAGNOSIS — M5416 Radiculopathy, lumbar region: Secondary | ICD-10-CM | POA: Diagnosis not present

## 2016-11-03 ENCOUNTER — Encounter: Payer: Self-pay | Admitting: Family Medicine

## 2016-11-06 DIAGNOSIS — M5441 Lumbago with sciatica, right side: Secondary | ICD-10-CM | POA: Diagnosis not present

## 2016-11-06 DIAGNOSIS — M545 Low back pain: Secondary | ICD-10-CM | POA: Diagnosis not present

## 2016-11-06 DIAGNOSIS — R262 Difficulty in walking, not elsewhere classified: Secondary | ICD-10-CM | POA: Diagnosis not present

## 2016-11-09 DIAGNOSIS — M5441 Lumbago with sciatica, right side: Secondary | ICD-10-CM | POA: Diagnosis not present

## 2016-11-09 DIAGNOSIS — R262 Difficulty in walking, not elsewhere classified: Secondary | ICD-10-CM | POA: Diagnosis not present

## 2016-11-09 DIAGNOSIS — M545 Low back pain: Secondary | ICD-10-CM | POA: Diagnosis not present

## 2016-11-11 ENCOUNTER — Encounter: Payer: Self-pay | Admitting: Family Medicine

## 2016-11-14 DIAGNOSIS — M48061 Spinal stenosis, lumbar region without neurogenic claudication: Secondary | ICD-10-CM | POA: Diagnosis not present

## 2016-11-27 DIAGNOSIS — M79604 Pain in right leg: Secondary | ICD-10-CM | POA: Diagnosis not present

## 2016-12-05 DIAGNOSIS — Z01818 Encounter for other preprocedural examination: Secondary | ICD-10-CM | POA: Diagnosis not present

## 2016-12-05 DIAGNOSIS — M48061 Spinal stenosis, lumbar region without neurogenic claudication: Secondary | ICD-10-CM | POA: Diagnosis not present

## 2016-12-06 DIAGNOSIS — Z01810 Encounter for preprocedural cardiovascular examination: Secondary | ICD-10-CM | POA: Diagnosis not present

## 2016-12-07 DIAGNOSIS — R9431 Abnormal electrocardiogram [ECG] [EKG]: Secondary | ICD-10-CM | POA: Diagnosis not present

## 2016-12-07 DIAGNOSIS — Z951 Presence of aortocoronary bypass graft: Secondary | ICD-10-CM | POA: Diagnosis not present

## 2016-12-07 DIAGNOSIS — Z01810 Encounter for preprocedural cardiovascular examination: Secondary | ICD-10-CM | POA: Diagnosis not present

## 2016-12-13 DIAGNOSIS — Z01810 Encounter for preprocedural cardiovascular examination: Secondary | ICD-10-CM | POA: Diagnosis not present

## 2016-12-14 DIAGNOSIS — Z01810 Encounter for preprocedural cardiovascular examination: Secondary | ICD-10-CM | POA: Diagnosis not present

## 2016-12-17 DIAGNOSIS — I2510 Atherosclerotic heart disease of native coronary artery without angina pectoris: Secondary | ICD-10-CM | POA: Diagnosis not present

## 2016-12-17 DIAGNOSIS — M47816 Spondylosis without myelopathy or radiculopathy, lumbar region: Secondary | ICD-10-CM | POA: Diagnosis not present

## 2016-12-17 DIAGNOSIS — Z951 Presence of aortocoronary bypass graft: Secondary | ICD-10-CM | POA: Diagnosis not present

## 2016-12-17 DIAGNOSIS — M48061 Spinal stenosis, lumbar region without neurogenic claudication: Secondary | ICD-10-CM | POA: Diagnosis not present

## 2016-12-17 DIAGNOSIS — M5416 Radiculopathy, lumbar region: Secondary | ICD-10-CM | POA: Diagnosis not present

## 2016-12-17 DIAGNOSIS — M5116 Intervertebral disc disorders with radiculopathy, lumbar region: Secondary | ICD-10-CM | POA: Diagnosis not present

## 2016-12-17 DIAGNOSIS — E785 Hyperlipidemia, unspecified: Secondary | ICD-10-CM | POA: Diagnosis not present

## 2016-12-17 DIAGNOSIS — D693 Immune thrombocytopenic purpura: Secondary | ICD-10-CM | POA: Diagnosis not present

## 2016-12-17 DIAGNOSIS — M79604 Pain in right leg: Secondary | ICD-10-CM | POA: Diagnosis not present

## 2016-12-17 DIAGNOSIS — N400 Enlarged prostate without lower urinary tract symptoms: Secondary | ICD-10-CM | POA: Diagnosis not present

## 2016-12-17 DIAGNOSIS — K219 Gastro-esophageal reflux disease without esophagitis: Secondary | ICD-10-CM | POA: Diagnosis not present

## 2017-02-04 ENCOUNTER — Telehealth: Payer: Self-pay | Admitting: Family Medicine

## 2017-02-04 NOTE — Telephone Encounter (Signed)
Deenniss from [facility] in [place] called and requested a medication refill on patients tamsulosin (FLOMAX) 0.4 MG CAPS capsule sent over to them. Patient is leaving out of state and needs his medication refill before he leaves. Please call pharmacy, thanks  Last Visit 05/23/2016 No upcoming appointments.   This is a request refill request from [place] for patient.

## 2017-02-04 NOTE — Telephone Encounter (Signed)
Will send over medication, which city in [place] are they calling from?

## 2017-02-05 MED ORDER — TAMSULOSIN HCL 0.4 MG PO CAPS: 0.8000 mg | ORAL_CAPSULE | Freq: Every day | ORAL | 1 refills | Status: DC

## 2017-02-05 NOTE — Telephone Encounter (Signed)
[facility]  [address], [place] [address]  [phone]

## 2017-02-05 NOTE — Telephone Encounter (Signed)
Refills sent

## 2017-03-27 ENCOUNTER — Telehealth: Payer: Self-pay | Admitting: Family Medicine

## 2017-03-27 NOTE — Telephone Encounter (Signed)
Dr. Jeananne Rama, would you happen to know any practicing PCPs in the [facility], [place] area?

## 2017-03-27 NOTE — Telephone Encounter (Signed)
Note for Dr. Jeananne Rama: [name] MRN:  [id number] and MRN:  [id number] are in the process of moving down to [facility], [place] and will no longer be seen at [facility] they were interested to know if Dr. Jeananne Rama had any referrals for PCP in that area near [facility], [place] thanks, knb

## 2017-06-11 DIAGNOSIS — Z23 Encounter for immunization: Secondary | ICD-10-CM | POA: Diagnosis not present

## 2017-06-27 DIAGNOSIS — I8393 Asymptomatic varicose veins of bilateral lower extremities: Secondary | ICD-10-CM | POA: Diagnosis not present

## 2017-06-27 DIAGNOSIS — L570 Actinic keratosis: Secondary | ICD-10-CM | POA: Diagnosis not present

## 2017-06-27 DIAGNOSIS — L812 Freckles: Secondary | ICD-10-CM | POA: Diagnosis not present

## 2017-06-27 DIAGNOSIS — D485 Neoplasm of uncertain behavior of skin: Secondary | ICD-10-CM | POA: Diagnosis not present

## 2017-06-27 DIAGNOSIS — Z1283 Encounter for screening for malignant neoplasm of skin: Secondary | ICD-10-CM | POA: Diagnosis not present

## 2017-06-27 DIAGNOSIS — D229 Melanocytic nevi, unspecified: Secondary | ICD-10-CM | POA: Diagnosis not present

## 2017-06-27 DIAGNOSIS — D1801 Hemangioma of skin and subcutaneous tissue: Secondary | ICD-10-CM | POA: Diagnosis not present

## 2017-06-27 DIAGNOSIS — I788 Other diseases of capillaries: Secondary | ICD-10-CM | POA: Diagnosis not present

## 2017-06-27 DIAGNOSIS — L821 Other seborrheic keratosis: Secondary | ICD-10-CM | POA: Diagnosis not present

## 2017-06-27 DIAGNOSIS — L578 Other skin changes due to chronic exposure to nonionizing radiation: Secondary | ICD-10-CM | POA: Diagnosis not present

## 2017-07-02 ENCOUNTER — Encounter: Payer: Self-pay | Admitting: Family Medicine

## 2017-07-02 ENCOUNTER — Ambulatory Visit (INDEPENDENT_AMBULATORY_CARE_PROVIDER_SITE_OTHER): Payer: Medicare Other | Admitting: Family Medicine

## 2017-07-02 VITALS — BP 128/71 | HR 59 | Ht 71.26 in | Wt 171.0 lb

## 2017-07-02 DIAGNOSIS — I2583 Coronary atherosclerosis due to lipid rich plaque: Secondary | ICD-10-CM

## 2017-07-02 DIAGNOSIS — N401 Enlarged prostate with lower urinary tract symptoms: Secondary | ICD-10-CM | POA: Diagnosis not present

## 2017-07-02 DIAGNOSIS — R350 Frequency of micturition: Secondary | ICD-10-CM | POA: Diagnosis not present

## 2017-07-02 DIAGNOSIS — I2510 Atherosclerotic heart disease of native coronary artery without angina pectoris: Secondary | ICD-10-CM

## 2017-07-02 DIAGNOSIS — E785 Hyperlipidemia, unspecified: Secondary | ICD-10-CM

## 2017-07-02 DIAGNOSIS — Z7189 Other specified counseling: Secondary | ICD-10-CM

## 2017-07-02 DIAGNOSIS — M519 Unspecified thoracic, thoracolumbar and lumbosacral intervertebral disc disorder: Secondary | ICD-10-CM

## 2017-07-02 DIAGNOSIS — Z0000 Encounter for general adult medical examination without abnormal findings: Secondary | ICD-10-CM

## 2017-07-02 MED ORDER — ROSUVASTATIN CALCIUM 10 MG PO TABS: 10.0000 mg | ORAL_TABLET | Freq: Every day | ORAL | 4 refills | Status: AC

## 2017-07-02 MED ORDER — AZELASTINE HCL 0.1 % NA SOLN: 2.0000 | Freq: Two times a day (BID) | NASAL | 4 refills | Status: AC

## 2017-07-02 MED ORDER — TAMSULOSIN HCL 0.4 MG PO CAPS: 0.8000 mg | ORAL_CAPSULE | Freq: Every day | ORAL | 4 refills | Status: AC

## 2017-07-02 MED ORDER — AMOXICILLIN 875 MG PO TABS: 875.0000 mg | ORAL_TABLET | Freq: Two times a day (BID) | ORAL | 0 refills | Status: AC

## 2017-07-02 MED ORDER — NITROGLYCERIN 0.4 MG SL SUBL: 0.4000 mg | SUBLINGUAL_TABLET | SUBLINGUAL | 3 refills | Status: AC | PRN

## 2017-07-02 NOTE — Assessment & Plan Note (Signed)
The current medical regimen is effective;  continue present plan and medications.  

## 2017-07-02 NOTE — Assessment & Plan Note (Signed)
A voluntary discussion about advance care planning including the explanation and discussion of advance directives was extensively discussed  with the patient.  Explanation about the health care proxy and Living will was reviewed and packet with forms with explanation of how to fill them out was given.    

## 2017-07-02 NOTE — Assessment & Plan Note (Signed)
Doing well now 
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
## 2017-07-03 ENCOUNTER — Encounter: Payer: Self-pay | Admitting: Family Medicine

## 2017-07-03 LAB — COMPREHENSIVE METABOLIC PANEL
ALBUMIN: 4.5 g/dL (ref 3.6–4.8)
ALT: 18 IU/L (ref 0–44)
AST: 25 IU/L (ref 0–40)
Albumin/Globulin Ratio: 1.7 (ref 1.2–2.2)
Alkaline Phosphatase: 65 IU/L (ref 39–117)
BUN / CREAT RATIO: 16 (ref 10–24)
BUN: 15 mg/dL (ref 8–27)
Bilirubin Total: 0.9 mg/dL (ref 0.0–1.2)
CALCIUM: 9.2 mg/dL (ref 8.6–10.2)
CO2: 25 mmol/L (ref 20–29)
Chloride: 100 mmol/L (ref 96–106)
Creatinine, Ser: 0.94 mg/dL (ref 0.76–1.27)
GFR, EST AFRICAN AMERICAN: 95 mL/min/{1.73_m2} (ref >59)
GFR, EST NON AFRICAN AMERICAN: 82 mL/min/{1.73_m2} (ref >59)
GLUCOSE: 95 mg/dL (ref 65–99)
Globulin, Total: 2.7 g/dL (ref 1.5–4.5)
Potassium: 4.3 mmol/L (ref 3.5–5.2)
Sodium: 139 mmol/L (ref 134–144)
TOTAL PROTEIN: 7.2 g/dL (ref 6.0–8.5)

## 2017-07-03 LAB — CBC WITH DIFFERENTIAL/PLATELET
BASOS ABS: 0 10*3/uL (ref 0.0–0.2)
Basos: 0 %
EOS (ABSOLUTE): 0.3 10*3/uL (ref 0.0–0.4)
Eos: 5 %
HEMATOCRIT: 43 % (ref 37.5–51.0)
Hemoglobin: 14.5 g/dL (ref 13.0–17.7)
Immature Grans (Abs): 0 10*3/uL (ref 0.0–0.1)
Immature Granulocytes: 0 %
LYMPHS ABS: 2 10*3/uL (ref 0.7–3.1)
Lymphs: 35 %
MCH: 31.9 pg (ref 26.6–33.0)
MCHC: 33.7 g/dL (ref 31.5–35.7)
MCV: 95 fL (ref 79–97)
MONOS ABS: 0.7 10*3/uL (ref 0.1–0.9)
Monocytes: 12 %
Neutrophils Absolute: 2.7 10*3/uL (ref 1.4–7.0)
Neutrophils: 48 %
PLATELETS: 132 10*3/uL — AB (ref 150–379)
RBC: 4.54 x10E6/uL (ref 4.14–5.80)
RDW: 13.3 % (ref 12.3–15.4)
WBC: 5.7 10*3/uL (ref 3.4–10.8)

## 2017-07-03 LAB — MICROSCOPIC EXAMINATION

## 2017-07-03 LAB — URINALYSIS, ROUTINE W REFLEX MICROSCOPIC
Bilirubin, UA: NEGATIVE
GLUCOSE, UA: NEGATIVE
Ketones, UA: NEGATIVE
Leukocytes, UA: NEGATIVE
Nitrite, UA: NEGATIVE
RBC UA: NEGATIVE
Specific Gravity, UA: 1.02 (ref 1.005–1.030)
Urobilinogen, Ur: 0.2 mg/dL (ref 0.2–1.0)
pH, UA: 5 (ref 5.0–7.5)

## 2017-07-03 LAB — TSH: TSH: 1.68 u[IU]/mL (ref 0.450–4.500)

## 2017-07-03 LAB — PSA: PROSTATE SPECIFIC AG, SERUM: 0.9 ng/mL (ref 0.0–4.0)

## 2017-07-03 LAB — LIPID PANEL
CHOL/HDL RATIO: 2.1 ratio (ref 0.0–5.0)
Cholesterol, Total: 130 mg/dL (ref 100–199)
HDL: 62 mg/dL (ref >39)
LDL CALC: 55 mg/dL (ref 0–99)
Triglycerides: 63 mg/dL (ref 0–149)
VLDL CHOLESTEROL CAL: 13 mg/dL (ref 5–40)

## 2017-07-07 ENCOUNTER — Encounter: Payer: Self-pay | Admitting: Family Medicine

## 2017-07-11 DIAGNOSIS — L570 Actinic keratosis: Secondary | ICD-10-CM | POA: Diagnosis not present

## 2018-02-20 ENCOUNTER — Telehealth: Payer: Self-pay

## 2018-02-20 NOTE — Telephone Encounter (Signed)
Copied from [place] [id number]. Topic: Medicare AWV >> Feb 20, 2018  2:37 PM Eugine Bubb, [place] A, LPN wrote: Reason for CRM: Called to schedule medicare annual wellness visit with NHA- Joshawa Dubin,LPN at [facility]. Please schedule anytime. Any questions please contact Joanette Gula on skype or by phone- [phone]

## 2018-05-02 DIAGNOSIS — K635 Polyp of colon: Secondary | ICD-10-CM | POA: Diagnosis not present

## 2018-05-02 DIAGNOSIS — Z86010 Personal history of colonic polyps: Secondary | ICD-10-CM | POA: Diagnosis not present

## 2018-05-02 DIAGNOSIS — D122 Benign neoplasm of ascending colon: Secondary | ICD-10-CM | POA: Diagnosis not present

## 2018-05-02 LAB — HM COLONOSCOPY

## 2018-05-07 DIAGNOSIS — D2230 Melanocytic nevi of unspecified part of face: Secondary | ICD-10-CM | POA: Diagnosis not present

## 2018-05-07 DIAGNOSIS — D1800 Hemangioma unspecified site: Secondary | ICD-10-CM | POA: Diagnosis not present

## 2018-05-07 DIAGNOSIS — L821 Other seborrheic keratosis: Secondary | ICD-10-CM | POA: Diagnosis not present

## 2018-05-07 DIAGNOSIS — L812 Freckles: Secondary | ICD-10-CM | POA: Diagnosis not present

## 2018-05-07 DIAGNOSIS — L578 Other skin changes due to chronic exposure to nonionizing radiation: Secondary | ICD-10-CM | POA: Diagnosis not present

## 2018-05-07 DIAGNOSIS — D229 Melanocytic nevi, unspecified: Secondary | ICD-10-CM | POA: Diagnosis not present

## 2018-05-07 DIAGNOSIS — Z1283 Encounter for screening for malignant neoplasm of skin: Secondary | ICD-10-CM | POA: Diagnosis not present

## 2018-05-07 DIAGNOSIS — L570 Actinic keratosis: Secondary | ICD-10-CM | POA: Diagnosis not present

## 2018-05-07 DIAGNOSIS — D225 Melanocytic nevi of trunk: Secondary | ICD-10-CM | POA: Diagnosis not present

## 2018-05-22 DIAGNOSIS — Z23 Encounter for immunization: Secondary | ICD-10-CM | POA: Diagnosis not present

## 2018-05-26 DIAGNOSIS — L570 Actinic keratosis: Secondary | ICD-10-CM | POA: Diagnosis not present

## 2018-07-07 DIAGNOSIS — N400 Enlarged prostate without lower urinary tract symptoms: Secondary | ICD-10-CM | POA: Diagnosis not present

## 2018-07-07 DIAGNOSIS — Z0000 Encounter for general adult medical examination without abnormal findings: Secondary | ICD-10-CM | POA: Diagnosis not present

## 2018-07-07 DIAGNOSIS — E785 Hyperlipidemia, unspecified: Secondary | ICD-10-CM | POA: Diagnosis not present

## 2018-07-07 DIAGNOSIS — L570 Actinic keratosis: Secondary | ICD-10-CM | POA: Diagnosis not present

## 2018-07-10 DIAGNOSIS — E785 Hyperlipidemia, unspecified: Secondary | ICD-10-CM | POA: Diagnosis not present

## 2018-07-10 DIAGNOSIS — N400 Enlarged prostate without lower urinary tract symptoms: Secondary | ICD-10-CM | POA: Diagnosis not present

## 2018-07-23 DIAGNOSIS — N400 Enlarged prostate without lower urinary tract symptoms: Secondary | ICD-10-CM | POA: Diagnosis not present

## 2018-07-23 DIAGNOSIS — D696 Thrombocytopenia, unspecified: Secondary | ICD-10-CM | POA: Diagnosis not present

## 2018-07-23 DIAGNOSIS — E785 Hyperlipidemia, unspecified: Secondary | ICD-10-CM | POA: Diagnosis not present
